# Patient Record
Sex: Male | Born: 1970 | Race: Black or African American | Hispanic: No | Marital: Single | Smoking: Current every day smoker
Health system: Southern US, Community
[De-identification: ages and names within clinical notes are randomized; demographics above are authoritative.]

## PROBLEM LIST (undated history)

## (undated) DIAGNOSIS — E119 Type 2 diabetes mellitus without complications: Secondary | ICD-10-CM

---

## 2006-03-13 ENCOUNTER — Ambulatory Visit (HOSPITAL_COMMUNITY): Admission: EM | Admit: 2006-03-13 | Discharge: 2006-03-14 | Payer: Self-pay | Admitting: Emergency Medicine

## 2007-05-16 ENCOUNTER — Emergency Department (HOSPITAL_COMMUNITY): Admission: EM | Admit: 2007-05-16 | Discharge: 2007-05-16 | Payer: Self-pay | Admitting: Emergency Medicine

## 2011-04-24 NOTE — H&P (Signed)
Jay Johns, Jay Johns NO.:  [id number]   MEDICAL RECORD NO.:  [id number]          PATIENT TYPE:  EMS   LOCATION:  ED                           FACILITY:  [facility]   PHYSICIAN:  Lebron Conners, M.D.   DATE OF BIRTH:  Apr 21, 1971   DATE OF ADMISSION:  03/13/2006  DATE OF DISCHARGE:                                HISTORY & PHYSICAL   CHIEF COMPLAINT:  Brush in the rectum.   PRESENT ILLNESS:  The patient is a 40 year old black male who for about 12  hours has had a handle of a large hairbrush stuck in his rectum.  It was  placed in there by his girlfriend during recreational activity.  The patient  thought he could get out but it has remained there and he could not get it  out and in the emergency department the PA was also unable to get it out  despite sedation.  He is referred to me for extraction.  He denies abdominal  pain.  His white count is slightly elevated by 11,600.  He he has not had  any nausea or vomiting or fever.   PAST MEDICAL HISTORY:  He has type 2 diabetes and is on metformin.  He has  hypertension on hydrochlorothiazide.  Both conditions are well controlled.  He is allergic to PENICILLIN.  Does not know what that causes but is thought  to have that allergy since childhood.  He smokes about a pack of cigarettes  a day.  Occasionally drinks alcoholic beverages in moderation.  His review  of systems in detail is unremarkable.   FAMILY HISTORY:  Unremarkable.   CHILDHOOD ILLNESSES:  Unremarkable.   PHYSICAL EXAMINATION:  GENERAL:  The patient is old overweight.  VITAL SIGNS:  Unremarkable and temperature is normal as recorded by nursing  staff.  HEENT:  The head, neck, eyes, ears, nose, mouth, throat are unremarkable  with no enlargement of the thyroid.  No carotid bruit.  No neck mass.  CHEST:  Clear to auscultation.  HEART:  Rate and rhythm normal.  No murmur or gallop.  ABDOMEN:  No mass, tenderness or organomegaly.  GENITALIA:  Normal.  EXTREMITIES:  Normal.  RECTAL:  There is a large hairbrush protruding from the rectum.  I can  gently manipulate the handle and it does budge.  I put my finger in and  cannot break the suction to get it out.  I cannot reach the tip of the  handle.   IMPRESSION:  Foreign body lodged in the rectum.   PLAN:  Will put the patient to sleep and attempt at removal.  He is aware  that if the object has significantly injured the rectum that he will need to  have a colostomy.  He understands this and gives his consent.      Lebron Conners, M.D.  Electronically Signed     WB/MEDQ  D:  03/13/2006  T:  03/13/2006  Job:  [id number]

## 2011-04-24 NOTE — Op Note (Signed)
Jay Johns, Jay Johns NO.:  [id number]   MEDICAL RECORD NO.:  [id number]          PATIENT TYPE:  EMS   LOCATION:  ED                           FACILITY:  [facility]   PHYSICIAN:  Lebron Conners, M.D.   DATE OF BIRTH:  [birth year]   DATE OF PROCEDURE:  03/13/2006  DATE OF DISCHARGE:                                 OPERATIVE REPORT   PRE AND POSTOPERATIVE DIAGNOSIS:  Foreign body lodged in the rectum.   OPERATION:  Extraction of foreign body, proctoscopy.   SURGEON:  Lebron Conners, M.D.   ANESTHESIA:  General and local.   PROCEDURE:  After the patient was monitored and given general anesthesia, we  transferred him carefully to the operating table so as not to cause the  foreign body be pushed further up into the rectum.  A large brush was seen  protruding from the anus.  Attempts had already been made to extract this  under sedation in the emergency department.  With the patient in the  lithotomy position, I lubricated my finger and carefully felt around the  foreign body.  I could put my finger past the end of it, particularly and  most easily on the left side.  As I carefully palpated the remainder, I felt  a hook with opening anterolaterally on the right intended for hanging the  brush.  I very carefully pushed my finger down from upward down toward the  anus and was able to separate the brush from the rectal mucosa and other  tissues.  It seemed as though it most likely had hooked there and there was  swelling around it, causing it to remain hooked.  I extracted it and then  carefully palpated the area and thought I felt evidence of minor injury, but  no true perforation.  I then examined the distal rectum and anal canal with  a large anoscope and saw no further injury distally.  Putting in the  proctoscope again I saw no sign of a through-and-through perforation, but  there was some evidence of some bleeding and some trauma to the anterior  aspect of the rectum.   I felt that I could not see things well enough to do  any repair and I thought that none was needed.  I also felt that no  colostomy was warranted at this point.  I then thoroughly anesthetized the  anal canal and sphincter musculature with long-acting local anesthetic.  Prior to leaving the operating room, Foley catheter was inserted.  The  patient was stable throughout the procedure.      Lebron Conners, M.D.  Electronically Signed     WB/MEDQ  D:  03/13/2006  T:  03/15/2006  Job:  [id number]

## 2011-09-24 LAB — I-STAT 8, (EC8 V) (CONVERTED LAB)
BUN: 11
Bicarbonate: 23
Chloride: 104
Glucose, Bld: 146 — ABNORMAL HIGH
Operator id: 272551
Sodium: 135
TCO2: 24
pCO2, Ven: 36.1 — ABNORMAL LOW
pH, Ven: 7.411 — ABNORMAL HIGH

## 2011-09-24 LAB — POCT I-STAT CREATININE: Creatinine, Ser: 0.9

## 2011-09-24 LAB — URINALYSIS, ROUTINE W REFLEX MICROSCOPIC
Ketones, ur: 15 — AB
Protein, ur: 30 — AB
Urobilinogen, UA: 1

## 2011-09-24 LAB — CBC
HCT: 42.4
MCV: 91.8
RBC: 4.62
WBC: 9.6

## 2011-09-24 LAB — DIFFERENTIAL
Basophils Relative: 0
Eosinophils Absolute: 0.2
Eosinophils Relative: 2
Neutrophils Relative %: 70

## 2011-09-24 LAB — COMPREHENSIVE METABOLIC PANEL
ALT: 27
CO2: 25
Creatinine, Ser: 0.93
GFR calc Af Amer: >60
GFR calc non Af Amer: >60
Glucose, Bld: 141 — ABNORMAL HIGH
Potassium: 3.7

## 2011-09-24 LAB — GC/CHLAMYDIA PROBE AMP, GENITAL: GC Probe Amp, Genital: NEGATIVE

## 2011-09-24 LAB — URINE MICROSCOPIC-ADD ON

## 2011-09-24 LAB — RPR: RPR Ser Ql: NONREACTIVE

## 2011-09-24 LAB — CULTURE, ROUTINE-ABSCESS

## 2013-09-11 ENCOUNTER — Emergency Department (HOSPITAL_BASED_OUTPATIENT_CLINIC_OR_DEPARTMENT_OTHER)
Admission: EM | Admit: 2013-09-11 | Discharge: 2013-09-11 | Disposition: A | Discharge status: Home / Self Care | Payer: Self-pay | Attending: Emergency Medicine | Admitting: Emergency Medicine

## 2013-09-11 ENCOUNTER — Encounter (HOSPITAL_BASED_OUTPATIENT_CLINIC_OR_DEPARTMENT_OTHER): Payer: Self-pay

## 2013-09-11 DIAGNOSIS — E119 Type 2 diabetes mellitus without complications: Secondary | ICD-10-CM | POA: Insufficient documentation

## 2013-09-11 DIAGNOSIS — F17200 Nicotine dependence, unspecified, uncomplicated: Secondary | ICD-10-CM | POA: Insufficient documentation

## 2013-09-11 DIAGNOSIS — Z760 Encounter for issue of repeat prescription: Secondary | ICD-10-CM | POA: Insufficient documentation

## 2013-09-11 DIAGNOSIS — R739 Hyperglycemia, unspecified: Secondary | ICD-10-CM

## 2013-09-11 DIAGNOSIS — R631 Polydipsia: Secondary | ICD-10-CM

## 2013-09-11 HISTORY — DX: Type 2 diabetes mellitus without complications: E11.9

## 2013-09-11 LAB — BASIC METABOLIC PANEL
Chloride: 93 mEq/L — ABNORMAL LOW (ref 96–112)
GFR calc Af Amer: >90 mL/min (ref >90)
Glucose, Bld: 363 mg/dL — ABNORMAL HIGH (ref 70–99)

## 2013-09-11 LAB — GLUCOSE, CAPILLARY: Glucose-Capillary: 295 mg/dL — ABNORMAL HIGH (ref 70–99)

## 2013-09-11 MED ORDER — SODIUM CHLORIDE 0.9 % IV BOLUS (SEPSIS)
1000.0000 mL | Freq: Once | INTRAVENOUS | Status: AC
  Administered 2013-09-11: 1000 mL via INTRAVENOUS

## 2013-09-11 MED ORDER — LIRAGLUTIDE 18 MG/3ML ~~LOC~~ SOPN: 1.8000 mg | PEN_INJECTOR | Freq: Every day | SUBCUTANEOUS | Status: AC

## 2013-09-11 NOTE — ED Provider Notes (Signed)
Medical screening examination/treatment/procedure(s) were performed by non-physician practitioner and as supervising physician I was immediately available for consultation/collaboration.   Kennetha Pearman B. Bernette Mayers, MD 09/11/13 [phone]

## 2013-09-11 NOTE — ED Provider Notes (Signed)
CSN: [id number]     Arrival date & time 09/11/13  1846 History   First MD Initiated Contact with Patient 09/11/13 1917     Chief Complaint  Patient presents with  . Hyperglycemia   (Consider location/radiation/quality/duration/timing/severity/associated sxs/prior Treatment) HPI Pt is a 42yo male with hx of diabetes presenting with c/o hyperglycemia.  Reports home CBG was 319, normally in 140s-150s.  States the only symptom he has is extra thirst, denies nausea, vomiting, numbness, tingling, lightheadedness, or pain.  Normally takes Victoza, Glipizide ER, and metformin.  Pt is seen at the [place] and has been out of his Victoza for the last 4 days.  Denies any other concerns at this time.  Past Medical History  Diagnosis Date  . Diabetes mellitus without complication    History reviewed. No pertinent past surgical history. No family history on file. History  Substance Use Topics  . Smoking status: Current Every Day Smoker  . Smokeless tobacco: Not on file  . Alcohol Use: Not on file    Review of Systems  Gastrointestinal: Negative for nausea, vomiting and abdominal pain.  Endocrine: Positive for polydipsia. Negative for polyphagia and polyuria.  Neurological: Negative for dizziness, weakness, light-headedness and numbness.  All other systems reviewed and are negative.    Allergies  Review of patient's allergies indicates no known allergies.  Home Medications   Current Outpatient Rx  Name  Route  Sig  Dispense  Refill  . Liraglutide (VICTOZA) 18 MG/3ML SOPN   Subcutaneous   Inject 1.8 mg into the skin daily.   1 pen   0    BP 151/102  Pulse 66  Temp(Src) 98.3 F (36.8 C) (Oral)  Resp 16  SpO2 98% Physical Exam  Nursing note and vitals reviewed. Constitutional: He is oriented to person, place, and time. He appears well-developed and well-nourished.  Pt lying comfortably in exam bed, NAD.    HENT:  Head: Normocephalic and atraumatic.  Eyes: Conjunctivae are normal. No  scleral icterus.  Neck: Normal range of motion.  Cardiovascular: Normal rate, regular rhythm and normal heart sounds.   Pulmonary/Chest: Effort normal and breath sounds normal. No respiratory distress. He has no wheezes. He has no rales. He exhibits no tenderness.  Abdominal: Soft. Bowel sounds are normal. He exhibits no distension and no mass. There is no tenderness. There is no rebound and no guarding.  Musculoskeletal: Normal range of motion.  Neurological: He is alert and oriented to person, place, and time. No cranial nerve deficit.  Skin: Skin is warm and dry.    ED Course  Procedures (including critical care time) Labs Review Labs Reviewed  GLUCOSE, CAPILLARY - Abnormal; Notable for the following:    Glucose-Capillary 335 (*)    All other components within normal limits  BASIC METABOLIC PANEL - Abnormal; Notable for the following:    Sodium 132 (*)    Chloride 93 (*)    Glucose, Bld 363 (*)    GFR calc non Af Amer 82 (*)    All other components within normal limits  GLUCOSE, CAPILLARY - Abnormal; Notable for the following:    Glucose-Capillary 295 (*)    All other components within normal limits   Imaging Review No results found.  MDM   1. Hyperglycemia   2. Polydipsia   3. Medication refill    Will check CBG and BMP.   CBG: 335. Fluids started.  Pt still denies any symptoms at this time.  BMP: unremarkable. Not concerned for DKA.  Repeat CBG: 295.  Pt still denies any symptoms at this time.  Discussed refilling his medication, Victoza and to f/u with PCP.   All labs/imaging/findings discussed with patient. All questions answered and concerns addressed. Will discharge pt home and have pt f/u with [facility] info provided. Return precautions given. Pt verbalized understanding and agreement with tx plan. Vitals: unremarkable. Discharged in stable condition.    Discussed pt with attending during ED encounter and agrees with plan.     Jay Finner, PA-C 09/11/13 2045  Jay Finner, PA-C 09/11/13 2046

## 2013-09-11 NOTE — ED Notes (Signed)
Been out of DM med for 4 days, here for hyperglycemia, CBG at home was 319. Only symptom is thirsty.

## 2015-11-29 ENCOUNTER — Emergency Department (HOSPITAL_BASED_OUTPATIENT_CLINIC_OR_DEPARTMENT_OTHER)
Admission: EM | Admit: 2015-11-29 | Discharge: 2015-11-29 | Disposition: A | Discharge status: Home / Self Care | Payer: Medicaid Other | Attending: Emergency Medicine | Admitting: Emergency Medicine

## 2015-11-29 ENCOUNTER — Emergency Department (HOSPITAL_BASED_OUTPATIENT_CLINIC_OR_DEPARTMENT_OTHER): Payer: Medicaid Other

## 2015-11-29 ENCOUNTER — Encounter (HOSPITAL_BASED_OUTPATIENT_CLINIC_OR_DEPARTMENT_OTHER): Payer: Self-pay

## 2015-11-29 DIAGNOSIS — Z79899 Other long term (current) drug therapy: Secondary | ICD-10-CM | POA: Diagnosis not present

## 2015-11-29 DIAGNOSIS — F17200 Nicotine dependence, unspecified, uncomplicated: Secondary | ICD-10-CM | POA: Insufficient documentation

## 2015-11-29 DIAGNOSIS — E119 Type 2 diabetes mellitus without complications: Secondary | ICD-10-CM | POA: Diagnosis not present

## 2015-11-29 DIAGNOSIS — J208 Acute bronchitis due to other specified organisms: Secondary | ICD-10-CM | POA: Insufficient documentation

## 2015-11-29 DIAGNOSIS — R05 Cough: Secondary | ICD-10-CM | POA: Diagnosis present

## 2015-11-29 MED ORDER — ALBUTEROL SULFATE HFA 108 (90 BASE) MCG/ACT IN AERS
2.0000 | INHALATION_SPRAY | RESPIRATORY_TRACT | Status: DC | PRN
  Administered 2015-11-29: 2 via RESPIRATORY_TRACT
  Filled 2015-11-29: qty 6.7

## 2015-11-29 NOTE — Discharge Instructions (Signed)
Return to the ED with any concerns including difficulty breathing despite using albuterol every 4 hours, not drinking fluids, decreased urine output, vomiting and not able to keep down liquids or medications, decreased level of alertness/lethargy, or any other alarming symptoms °

## 2015-11-29 NOTE — ED Notes (Signed)
Cough, chest congestion x 1 week-pt child also being seen for same-pt NAD

## 2015-11-29 NOTE — ED Provider Notes (Signed)
CSN: [id number]     Arrival date & time 11/29/15  1448 History   First MD Initiated Contact with Patient 11/29/15 1532     Chief Complaint  Patient presents with  . Cough     (Consider location/radiation/quality/duration/timing/severity/associated sxs/prior Treatment) HPI  Pt presenting with cough x 1 week.  Daughter with similar symptoms. He also has nasal congestion.  No fever.  Cough is productive.  No chest pain. He continues to eat and drink normally.  No vomiting or change in stools.  No abdominal pain.  He has not had any treatment prior to arrival.  There are no other associated systemic symptoms, there are no other alleviating or modifying factors.   Past Medical History  Diagnosis Date  . Diabetes mellitus without complication (HCC)    History reviewed. No pertinent past surgical history. No family history on file. Social History  Substance Use Topics  . Smoking status: Current Every Day Smoker  . Smokeless tobacco: None  . Alcohol Use: No    Review of Systems  ROS reviewed and all otherwise negative except for mentioned in HPI    Allergies  Review of patient's allergies indicates no known allergies.  Home Medications   Prior to Admission medications   Medication Sig Start Date End Date Taking? Authorizing Provider  Liraglutide (VICTOZA) 18 MG/3ML SOPN Inject 1.8 mg into the skin daily. 09/11/13   Junius FinnerErin O'Malley, PA-C   BP 134/84 mmHg  Pulse 102  Temp(Src) 97.8 F (36.6 C) (Oral)  Resp 18  Ht 5\' 10"  (1.778 m)  Wt 235 lb (106.595 kg)  BMI 33.72 kg/m2  SpO2 98%  Vitals reviewed Physical Exam  Physical Examination: General appearance - alert, well appearing, and in no distress Mental status - alert, oriented to person, place, and time Eyes -no conjunctival injection, no scleral icterus Mouth - mucous membranes moist, pharynx normal without lesions Chest - clear to auscultation, no wheezes, rales or rhonchi, symmetric air entry Heart - normal rate, regular  rhythm, normal S1, S2, no murmurs, rubs, clicks or gallops Abdomen - soft, nontender, nondistended, no masses or organomegaly Neurological - alert, oriented, normal speech Extremities - peripheral pulses normal, no pedal edema, no clubbing or cyanosis Skin - normal coloration and turgor, no rashes  ED Course  Procedures (including critical care time) Labs Review Labs Reviewed - No data to display  Imaging Review Dg Chest 2 View  11/29/2015  CLINICAL DATA:  44 year old with 1 week history of cough and chest congestion. Current history of diabetes. EXAM: CHEST  2 VIEW COMPARISON:  None. FINDINGS: Cardiac silhouette upper normal in size. Hilar and mediastinal contours unremarkable. Lungs clear. Bronchovascular markings normal. Pulmonary vascularity normal. No visible pleural effusions. No pneumothorax. Mild degenerative changes involving the mid thoracic spine. IMPRESSION: No acute cardiopulmonary disease. Electronically Signed   By: Hulan Saashomas  Lawrence M.D.   On: 11/29/2015 16:09   I have personally reviewed and evaluated these images and lab results as part of my medical decision-making.   EKG Interpretation None      MDM   Final diagnoses:  Viral bronchitis    Pt presenting with c/o cough and congestion over the past week. Pt with hx of DM and smoker so will get CXR.  This was reassuring. Pt given albuterol inhaler to help with cough.  Discharged with strict return precautions.  Pt agreeable with plan.    Jerelyn ScottMartha Linker, MD 11/30/15 [phone]

## 2015-12-03 ENCOUNTER — Emergency Department (HOSPITAL_COMMUNITY)
Admission: EM | Admit: 2015-12-03 | Discharge: 2015-12-03 | Disposition: A | Discharge status: Home / Self Care | Payer: Medicaid Other | Source: Home / Self Care | Attending: Emergency Medicine | Admitting: Emergency Medicine

## 2015-12-03 ENCOUNTER — Encounter (HOSPITAL_COMMUNITY): Payer: Self-pay | Admitting: Emergency Medicine

## 2015-12-03 DIAGNOSIS — J329 Chronic sinusitis, unspecified: Secondary | ICD-10-CM

## 2015-12-03 DIAGNOSIS — J40 Bronchitis, not specified as acute or chronic: Secondary | ICD-10-CM

## 2015-12-03 MED ORDER — FLUTICASONE PROPIONATE 50 MCG/ACT NA SUSP: 2.0000 | Freq: Every day | NASAL | Status: AC

## 2015-12-03 MED ORDER — GUAIFENESIN-CODEINE 100-10 MG/5ML PO SYRP: 10.0000 mL | ORAL_SOLUTION | Freq: Four times a day (QID) | ORAL | Status: AC | PRN

## 2015-12-03 MED ORDER — PREDNISONE 50 MG PO TABS: 50.0000 mg | ORAL_TABLET | Freq: Every day | ORAL | Status: AC

## 2015-12-03 MED ORDER — AZITHROMYCIN 250 MG PO TABS: 250.0000 mg | ORAL_TABLET | Freq: Every day | ORAL | Status: AC

## 2015-12-03 NOTE — ED Notes (Signed)
Pt here with continuous cough, congestions with yellow phlegm x 1 week Pt was seen 12/23 [facility] with same sx's, given breathing treatment Denies sore throat,n,v or fever Need PCP

## 2015-12-03 NOTE — ED Provider Notes (Signed)
HPI  SUBJECTIVE:  Jay SidleShawn L Johns is a 44 y.o. male who presents with upper respiratory like symptoms for 10-12 days. He reports nasal congestion, postnasal drip, mild, gradual, "irritating" frontal headache, cough productive of white yellowish mucus. He reports dull achy chest pain with coughing. He denies other chest pain. No nausea, vomiting, fevers. No wheezing, shortness of breath, dyspnea on exertion. No ear pain, sinus pain/pressure. He is unable to sleep at night secondary to all the coughing. No postnasal drip. No orthopnea, extremity edema, unintentional weight gain, nocturia. He has sick contacts with similar symptoms. No history of double sickening, but states that he is gradually getting worse. He is a smoker has been smoking pack a day for 27 years.  Patient was seen on 12/24 for the same, thought to have viral bronchitis. He was given albuterol inhaler. Patient is a smoker. Past medical history of diabetes.  PMD none.  Past Medical History  Diagnosis Date  . Diabetes mellitus without complication (HCC)     History reviewed. No pertinent past surgical history.  No family history on file.  Social History  Substance Use Topics  . Smoking status: Current Every Day Smoker  . Smokeless tobacco: None  . Alcohol Use: Yes     Comment: ocassionally    No current facility-administered medications for this encounter.  Current outpatient prescriptions:  .  metFORMIN (GLUCOPHAGE) 1000 MG tablet, Take 1,000 mg by mouth 2 (two) times daily with a meal., Disp: , Rfl:  .  azithromycin (ZITHROMAX Z-PAK) 250 MG tablet, Take 1 tablet (250 mg total) by mouth daily. Take 2 tabs by mouth on days #1, 1 tab by mouth and days 2 through 5., Disp: 6 tablet, Rfl: 0 .  fluticasone (FLONASE) 50 MCG/ACT nasal spray, Place 2 sprays into both nostrils daily., Disp: 16 g, Rfl: 0 .  guaiFENesin-codeine (CHERATUSSIN AC) 100-10 MG/5ML syrup, Take 10 mLs by mouth 4 (four) times daily as needed for cough or  congestion., Disp: 120 mL, Rfl: 0 .  Liraglutide (VICTOZA) 18 MG/3ML SOPN, Inject 1.8 mg into the skin daily., Disp: 1 pen, Rfl: 0 .  predniSONE (DELTASONE) 50 MG tablet, Take 1 tablet (50 mg total) by mouth daily with breakfast., Disp: 5 tablet, Rfl: 0  No Known Allergies   ROS  As noted in HPI.   Physical Exam  BP 132/94 mmHg  Pulse 88  Temp(Src) 98.2 F (36.8 C) (Oral)  SpO2 96%  Constitutional: Well developed, well nourished, no acute distress Eyes:  EOMI, conjunctiva normal bilaterally HENT: Normocephalic, atraumatic,mucus membranes moist. TMs normal bilaterally. Positive nasal congestion clear rhinorrhea, erythematous swollen turbinates, positive postnasal drip, oropharynx within normal limits. No sinus tenderness. Respiratory: Normal inspiratory effort, good air movement, lungs clear bilaterally. Positive chest wall tenderness Cardiovascular: Normal rate regular rhythm, no murmurs, rubs, gallops GI: nondistended skin: No rash, skin intact Musculoskeletal: no deformities Neurologic: Alert & oriented x 3, no focal neuro deficits Psychiatric: Speech and behavior appropriate   ED Course   Medications - No data to display  No orders of the defined types were placed in this encounter.    No results found for this or any previous visit (from the past 24 hour(s)). No results found.  ED Clinical Impression  Bronchitis  Rhinosinusitis   ED Assessment/Plan  Previous records reviewed. See history of present illness.  He had a normal chest x-ray 5 days ago. Reviewed imaging and report. Given that he has had no fevers since, or change in his shortness  of breath, change in his cough we'll not repeat this today. He has not taken antipyretic in the past 4-6 hours.  Given the patient has had symptoms for 10-12 days, is getting worse and not getting better, feel that the likelihood of secondary bacterial infection is higher. Lungs are clear he is satting 96% on room air  here today however, we'll send home with azithromycin for 5 days. Also because of likelihood of him having component of COPD given his smoking, we'll also sent home with 5 days of steroids. He is to  start Flonase, saline nasal irrigation, Mucinex. Cheratussin as well. We'll give primary care referral.   Discussed MDM, plan and followup with patient. Gave instructions on when to go to the ER. Patient  agrees with plan.   *This clinic note was created using Dragon dictation software. Therefore, there may be occasional mistakes despite careful proofreading.  ?   Domenick Gong, MD 12/03/15 [phone]

## 2015-12-03 NOTE — Discharge Instructions (Signed)
2 puffs from your albuterol inhaler every 4-6 hours or as needed for coughing and wheezing. The prednisone will elevate your glucose for the next several days, go to the emergency room if it goes above 500. Your glucose should normalize several days after finishing the prednisone. Go to the ER for chest pain, worsening shortness of breath, or other concerns. Otherwise follow-up with primary care physician of your choice. See list below.  Use regular Mucinex to help thin the mucus secretions, saline nasal irrigation, Flonase  This practice is taking new patients. They will see you even if you do not have insurance.  [facility] [address] [place], [place]  [address] [phone]  Go to www.goodrx.com to look up your medications. This will give you a list of where you can find your prescriptions at the most affordable prices.   If you have no primary doctor, here are some resources that may be helpful:  Medicaid-accepting [facility] Providers: - [facility]- [address] [place] [address]  [phone];   - [facility]- [address] [place], [address] [phone]  - [facility]- [address] [phone] [facility]- [address]  [phone]  - Renaye Rakers- [address] [phone]  Only accepts [place] patients       after they have her name applied to their card  -Dr. Jackie Plum, [facility]. [address]    [place], [place] [address]  [phone]  Self Pay (no insurance) in [place]: - Sickle Cell Patients: Dr Willey Blade, [facility] [address] [place] [phone]  - Health [phone]  - Physician Referral Service- [phone]  - [facility]- [address], [place], [phone];  Monday to Friday, 9 a.m. - 7 p.m.; Saturday  9 a.m. to 1 p.m.  [facility]- [address] [place], [place] [phone]  - [facility]- [address]      [phone] - [facility]- [address] [phone]  [facility], [address], [place]; [phone]; or [address] [place]; [phone].   [employer] [place], [place]. [address], [place]; [phone]; Monday to Wednesday, 8:30 a.m. - 5 p.m.; Thursday, 8:30 a.m. - 8 p.m.  [facility], [address], [place]; [phone]; Monday to Friday, 8 a.m. - 4:30 p.m.   [facility], [place] [address] [place], [phone]; first and third Saturday of the month, 9:30 a.m. - 12:30 p.m.  [facility], [address], [place], [phone]; second Saturday of the month, 9 a.m. -noon.  [facility] for children. For information, call [phone]; [phone]; or [phone].  Other agencies that provide inexpensive medical care:     [facility]  [phone]    [facility]  [phone]    [facility]  [phone] [address] [place] [address]    [facility]  [phone]    [facility]  [phone], [phone]; or [phone].  Chronic Pain Problems Contact [facility]  [phone] Patients need to be referred by their primary care doctor.  [facility] [place]     [employer]  [facility] [address] [place]                       [address]      [address] [place]   [phone] (After Hours)  General Information: Finding a doctor when you do not have health insurance can be tricky. Although you are not limited by an insurance plan, you are of course limited by her finances and how much but he can pay out of pocket.  What are your options if you don't have health insurance?   1) Find a Librarian, academicDoctor and Pay  Out of Pocket Although you won't have to find out who is covered by your insurance plan, it is a good idea to ask around and get recommendations. You will then need to call the office and see if the doctor you have chosen will accept you as a new patient and what types of options they offer for patients who are self-pay. Some doctors offer discounts or will set up payment plans for their patients who do not have insurance, but you will need to ask so you aren't surprised when you get to your appointment.  2) Contact Your Local Health Department Not all health departments have doctors that can see patients for sick visits, but many do, so it is worth a call to see if yours does. If you don't know where your local health department is, you can check in your phone book. The CDC also has a tool to help you locate your state's health department, and many state websites also have listings of all of their local health departments.  3) Find a Walk-in Clinic If your illness is not likely to be very severe or complicated, you may want to try a walk in clinic. These are popping up all over the country in pharmacies, drugstores, and shopping centers. They're usually staffed by nurse practitioners or physician assistants that have been trained to treat common illnesses and complaints. They're usually fairly quick and inexpensive. However, if you have serious medical issues or chronic medical problems, these are probably not your best option

## 2017-08-10 IMAGING — DX DG CHEST 2V
2 series · 2 of 2 positions shown · non-contrast
Comparison: None.

CLINICAL DATA: 42-year-old with 1 week history of cough and chest
congestion. Current history of diabetes.

EXAM:
CHEST  2 VIEW

[chest pa]
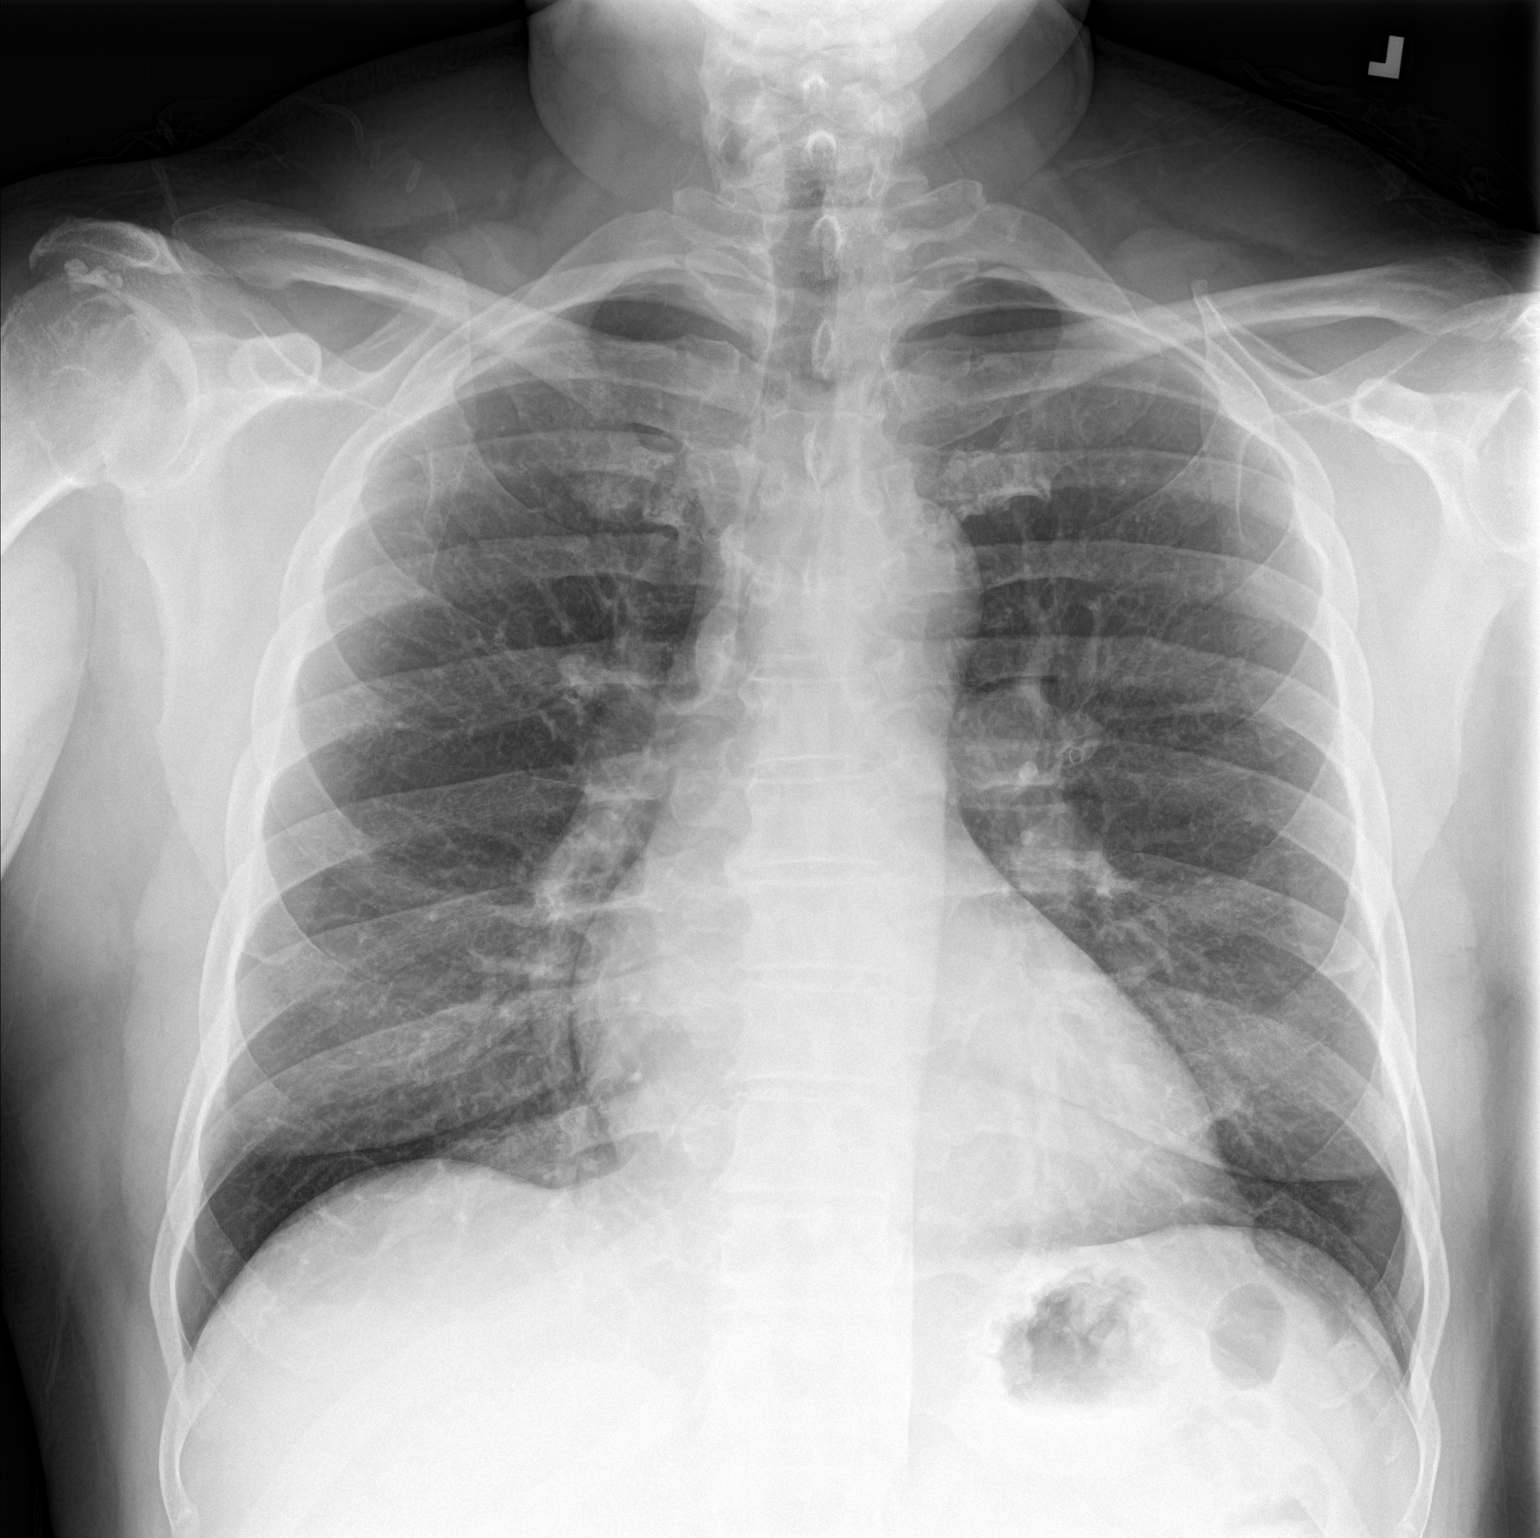

[chest lat]
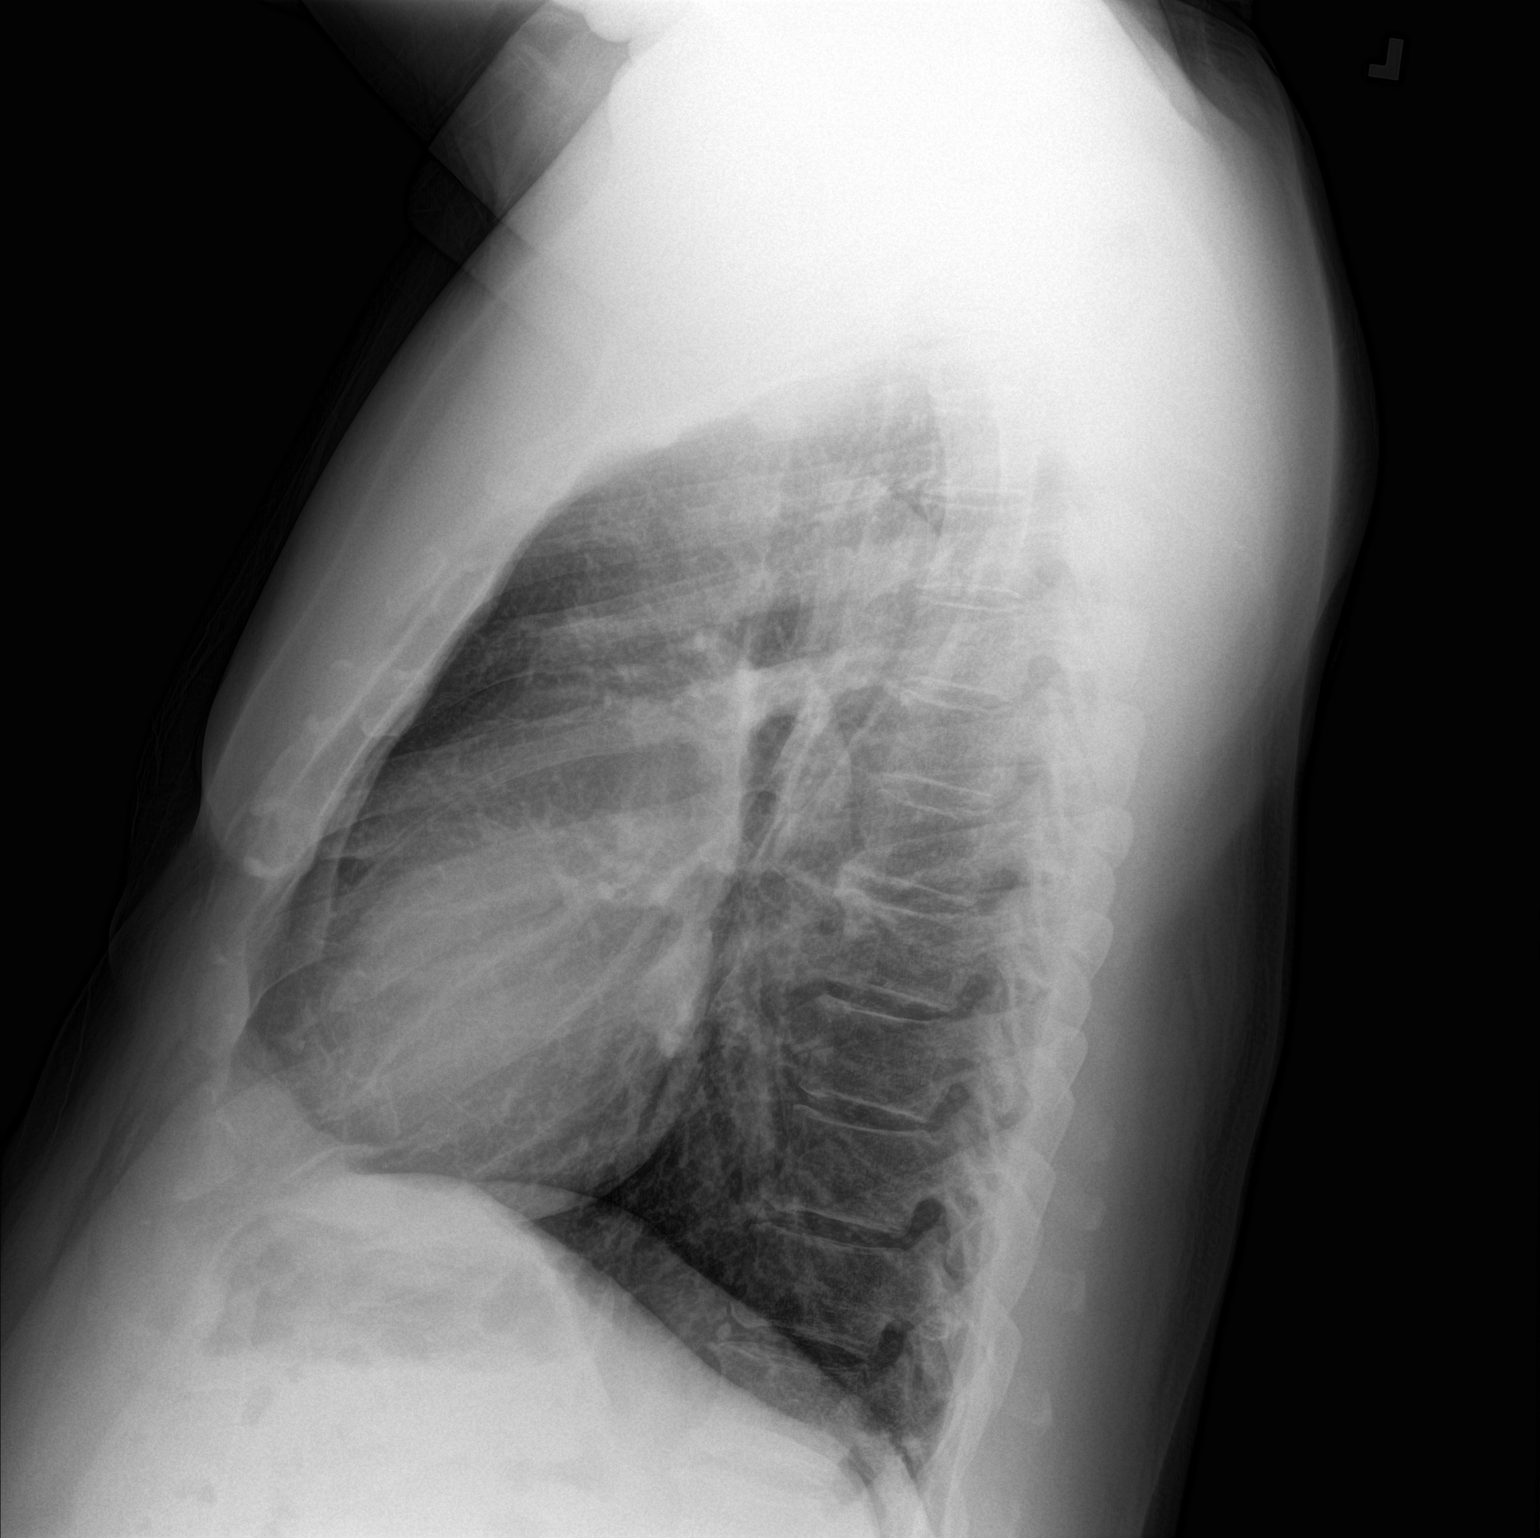

[2 of 2 positions shown; findings below may reference images not displayed]

FINDINGS: Cardiac silhouette upper normal in size. Hilar and mediastinal
contours unremarkable. Lungs clear. Bronchovascular markings normal.
Pulmonary vascularity normal. No visible pleural effusions. No
pneumothorax. Mild degenerative changes involving the mid thoracic
spine.
IMPRESSION: No acute cardiopulmonary disease.
